# Patient Record
Sex: Female | Born: 1985 | Race: Black or African American | Hispanic: No | Marital: Married | Smoking: Never smoker
Health system: Southern US, Community
[De-identification: ages and names within clinical notes are randomized; demographics above are authoritative.]

## PROBLEM LIST (undated history)

## (undated) DIAGNOSIS — Z789 Other specified health status: Secondary | ICD-10-CM

## (undated) HISTORY — PX: NO PAST SURGERIES: SHX2092

---

## 2016-12-15 ENCOUNTER — Other Ambulatory Visit (HOSPITAL_COMMUNITY): Payer: Self-pay | Admitting: Obstetrics and Gynecology

## 2016-12-15 DIAGNOSIS — Z3689 Encounter for other specified antenatal screening: Secondary | ICD-10-CM

## 2016-12-15 DIAGNOSIS — Z3A18 18 weeks gestation of pregnancy: Secondary | ICD-10-CM

## 2016-12-15 DIAGNOSIS — O280 Abnormal hematological finding on antenatal screening of mother: Secondary | ICD-10-CM

## 2016-12-16 ENCOUNTER — Encounter (HOSPITAL_COMMUNITY): Payer: Self-pay | Admitting: *Deleted

## 2016-12-17 ENCOUNTER — Ambulatory Visit (HOSPITAL_COMMUNITY)
Admission: RE | Admit: 2016-12-17 | Discharge: 2016-12-17 | Disposition: A | Discharge status: Home / Self Care | Payer: Medicaid Other | Source: Ambulatory Visit | Attending: Obstetrics and Gynecology | Admitting: Obstetrics and Gynecology

## 2016-12-17 ENCOUNTER — Other Ambulatory Visit (HOSPITAL_COMMUNITY): Payer: Self-pay | Admitting: *Deleted

## 2016-12-17 ENCOUNTER — Encounter (HOSPITAL_COMMUNITY): Payer: Self-pay

## 2016-12-17 DIAGNOSIS — Z315 Encounter for genetic counseling: Secondary | ICD-10-CM | POA: Insufficient documentation

## 2016-12-17 DIAGNOSIS — Z3689 Encounter for other specified antenatal screening: Secondary | ICD-10-CM | POA: Insufficient documentation

## 2016-12-17 DIAGNOSIS — O289 Unspecified abnormal findings on antenatal screening of mother: Secondary | ICD-10-CM | POA: Diagnosis present

## 2016-12-17 DIAGNOSIS — Z3A18 18 weeks gestation of pregnancy: Secondary | ICD-10-CM | POA: Diagnosis not present

## 2016-12-17 DIAGNOSIS — O280 Abnormal hematological finding on antenatal screening of mother: Secondary | ICD-10-CM

## 2016-12-17 HISTORY — DX: Other specified health status: Z78.9

## 2016-12-17 NOTE — Progress Notes (Signed)
Genetic Counseling  High-Risk Gestation Note  Appointment Date:  12/17/2016 Referred By: Caroll Rancher, MD Date of Birth:  Jun 08, 1986   Pregnancy History: [id number] Estimated Date of Delivery: 05/17/17 Estimated Gestational Age: 80w3dAttending: MGriffin Dakin MD  Ms. DDanae OrleansAbdelKarim was seen for genetic counseling because of an increased risk for fetal Down syndrome [facility] Ms. DYvette Lovelessdeclined the use of medical interpreter.   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  We reviewed chromosomes, nondisjunction, and the common features and variable prognosis of Down syndrome.  In addition, we reviewed the screen adjusted reduction in risks for trisomy 18 and open neural tube defects.  We also discussed other explanations for a screen positive result including: a gestational dating error, differences in maternal metabolism, and normal variation.  We reviewed other available screening options including noninvasive prenatal screening (NIPS)/cell free DNA (cfDNA) screening, and detailed ultrasound.  She was counseled that screening tests are used to modify a patient's a priori risk for aneuploidy, typically based on age. This estimate provides a pregnancy specific risk assessment. We reviewed the  benefits and limitations of each option. Specifically, we discussed the conditions for which each test screens, the detection rates, and false positive rates of each. She was also counseled regarding diagnostic testing via amniocentesis. We reviewed the approximate 1 in 3505-697risk for complications from amniocentesis, including spontaneous pregnancy loss. We discussed the possible results that the tests might provide including: positive, negative, unanticipated, and no result. Finally, they were counseled regarding the cost of each option and potential out of pocket expenses. After consideration of all the options, she elected to proceed with NIPS (Panorama).  Those results will be available in 8-10 days.  She declined amniocentesis.   A complete ultrasound was performed today. The ultrasound report will be sent under separate cover. There were no visualized fetal anomalies or markers suggestive of aneuploidy. Diagnostic testing was declined today.  She understands that screening tests cannot rule out all birth defects or genetic syndromes. The patient was advised of this limitation and states she still does not want additional testing at this time.   Both family histories were reviewed and found to be contributory for consanguinity; the patient and her partner are reportedly second cousins. She reported that her paternal grandmother and his maternal grandmother are sisters to each other.  We discussed that children born to a consanguineous couple are at increased risk for genetic health problems.  This increase in risk is related to the possibility of passing on recessive genes. We explained that every person carries approximately 7-10 non-working genes that when received in a double dose results in recessive genetic conditions.  In general, unrelated couples have a relatively low risk of having a child with a recessive condition because the likelihood of both parents carrying the same non-working recessive  gene is very low.  However, when a couple is related, they have  inherited some of their genetic information from the same family member, which leads to an increased chance that they may carry the same recessive gene and have a child with a recessive condition.  For second cousin unions, the risk to have a child with a birth defect, intellectual disability, or genetic condition is increased approximately 1% above the general population risk (3-5%).  We reviewed chromosomes, genes, and recessive inheritance in detail.  There are a myriad of genetic disorders that occur more frequently in specific ethnic groups, those which can be traced to particular geographic locations. We discussed that although these genetic disorders are much more prevalent in specific ethnic groups, as families are becoming increasingly multiracial and multicultural, these conditions can occur in anyone from any race or ethnicity. For this reason, we discussed the availability of ethnic specific genetic carrier screening, professional society (ACOG) recommended carrier testing, and pan-ethnic carrier screening.   We reviewed that ACOG currently recommends that all patients be offered carrier screening for cystic fibrosis, spinal muscular atrophy and hemoglobinopathies. In addition, they were counseled that there are a variety of genetic screening laboratories that have pan-ethnic, or expanded, carrier screening panels, which evaluate carrier status for a wide range of genetic conditions. Some of these conditions are severe and actionable, but also rare; others occur more commonly, but are less severe. We discussed that testing options range from screening for a single condition to panels of more than 200 autosomal or X-linked genetic conditions. We reviewed that the prevalence of each condition varies (and often varies with ethnicity). Thus the couples' background risk to be a carrier for each of these various conditions would range, and in  some cases be very low or unknown. Similarly, the detection rate varies with each condition and also varies in some cases with ethnicity, ranging from greater than 99% (in the case of hemoglobinopathies) to unknown. We reviewed that a negative carrier screen would thus reduce, but not eliminate the chance to be a carrier for these conditions. For some conditions included on specific pan-ethnic carrier screening panels, the pre-test carrier frequency and/or the detection rate is unknown. Thus, for some conditions, the exact reduction of risk with a negative carrier screening result may not be able to be quantified. We reviewed that in the event that one partner is found to be a carrier for one or more conditions, carrier screening would be available to the partner for those conditions. After thoughtful consideration of their options, Ms. Jersi Mori elected to have the universal carrier screening panel through [facility]. This panel screens for carrier status of 175 hereditary disorders. We discussed that results will be available in approximately 2 weeks. Without further information regarding the provided family history, an accurate genetic risk cannot be calculated. Further genetic counseling is warranted if more information is obtained.  Ms. Laurabeth Tregoning denied exposure to environmental toxins or chemical agents. She denied the use of alcohol, tobacco or street drugs. She denied significant viral illnesses during the course of her pregnancy. Her medical and surgical histories were noncontributory.   I counseled Ms. Sacred Chestang for approximately 45 minutes regarding the above risks and available options.    , MS,  Certified Genetic Counselor 12/17/2016  

## 2016-12-23 ENCOUNTER — Other Ambulatory Visit: Payer: Self-pay

## 2016-12-24 ENCOUNTER — Telehealth (HOSPITAL_COMMUNITY): Payer: Self-pay | Admitting: MS"

## 2016-12-24 ENCOUNTER — Encounter (HOSPITAL_COMMUNITY): Payer: Self-pay

## 2016-12-24 ENCOUNTER — Other Ambulatory Visit: Payer: Self-pay

## 2016-12-24 NOTE — Telephone Encounter (Signed)
Called Patsy Kenton to discuss her prenatal cell free DNA test results.  Ms. Elenore PaddyDarsalam Guier had Panorama testing through [facility].  Testing was offered because of abnormal maternal serum screen.   The patient was identified by name and DOB.  We reviewed that these are within normal limits, showing a less than 1 in 10,000 risk for trisomies 21, 18 and 13, and monosomy X (Turner syndrome).  In addition, the risk for triploidy and sex chromosome trisomies (47,XXX and 47,XXY) was also low risk.  We reviewed that this testing identifies > 99% of pregnancies with trisomy 4421, trisomy 5413, sex chromosome trisomies (47,XXX and 47,XXY), and triploidy. The detection rate for trisomy 18 is 96%.  The detection rate for monosomy X is ~92%.  The false positive rate is <0.1% for all conditions. Testing was also consistent with female fetal sex.  The patient did wish to know fetal sex.  She understands that this testing does not identify all genetic conditions.  All questions were answered to her satisfaction, she was encouraged to call with additional questions or concerns. [facility] carrier screening (universal panel) is currently pending.   Quinn PlowmanKaren Nikia Levels, MS Certified Genetic Counselor 12/24/2016 2:34 PM

## 2016-12-27 ENCOUNTER — Other Ambulatory Visit: Payer: Self-pay

## 2016-12-28 ENCOUNTER — Telehealth (HOSPITAL_COMMUNITY): Payer: Self-pay | Admitting: MS"

## 2016-12-28 NOTE — Telephone Encounter (Signed)
Called Wendy May to discuss her carrier screening results. Wendy May had carrier screening for 175 autosomal recessive and X-linked conditions, including the ACOG recommended conditions (SMA, CF, and hemoglobinopathies) through [facility] given reported consanguinity for herself and her partner. The patient was identified by name and DOB. We reviewed that the results are negative, indicating that she does not have a detectable gene alteration in any of the genes for which analysis was performed. We reviewed that carrier screening does not detect all carriers of these conditions, but a normal result significantly decreases the likelihood of being a carrier, and therefore, the overall reproductive risk. We reviewed that [facility] sequences most of the genes, which is associated with a high detection rate for carriers, thus a negative screen is very reassuring. All questions were answered to her satisfaction, she was encouraged to call with additional questions or concerns. ? Quinn PlowmanKaren Nylan Nakatani, MS Patent attorneyCertified Genetic Counselor

## 2017-01-14 ENCOUNTER — Ambulatory Visit (HOSPITAL_COMMUNITY)
Admission: RE | Admit: 2017-01-14 | Discharge: 2017-01-14 | Disposition: A | Discharge status: Home / Self Care | Payer: Medicaid Other | Source: Ambulatory Visit | Attending: Obstetrics and Gynecology | Admitting: Obstetrics and Gynecology

## 2017-01-14 ENCOUNTER — Encounter (HOSPITAL_COMMUNITY): Payer: Self-pay

## 2017-01-14 DIAGNOSIS — Z3A22 22 weeks gestation of pregnancy: Secondary | ICD-10-CM | POA: Insufficient documentation

## 2017-01-14 DIAGNOSIS — O280 Abnormal hematological finding on antenatal screening of mother: Secondary | ICD-10-CM | POA: Insufficient documentation

## 2017-01-14 DIAGNOSIS — Z362 Encounter for other antenatal screening follow-up: Secondary | ICD-10-CM | POA: Insufficient documentation

## 2017-08-09 ENCOUNTER — Encounter (HOSPITAL_COMMUNITY): Payer: Self-pay

## 2018-05-15 IMAGING — US US MFM OB FOLLOW-UP
1 series · 14 of 28 positions shown · non-contrast
Comparison: none

[Series 1: us mfm ob follow-up · 52 acquisitions, 14 frames shown]
[im 2/52]
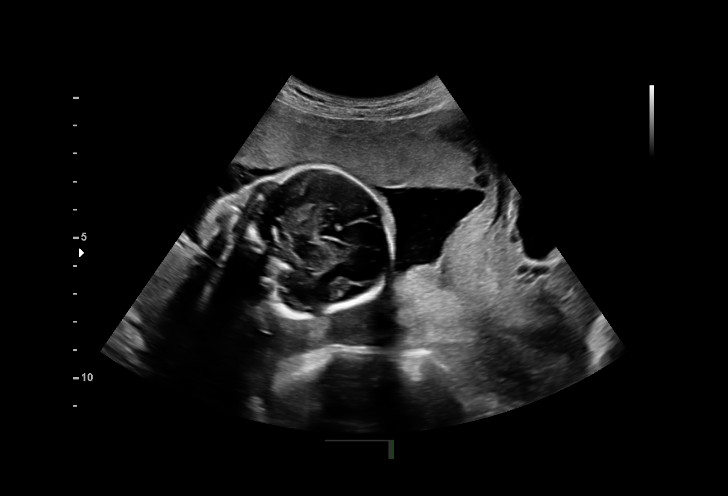
[im 6/52]
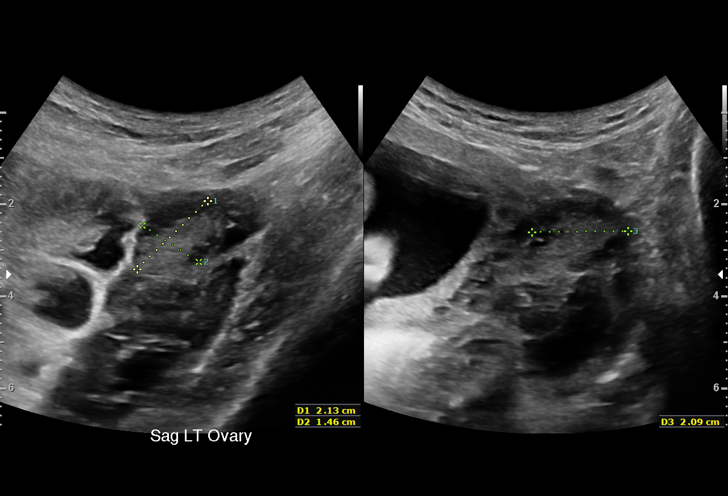
[im 10/52]
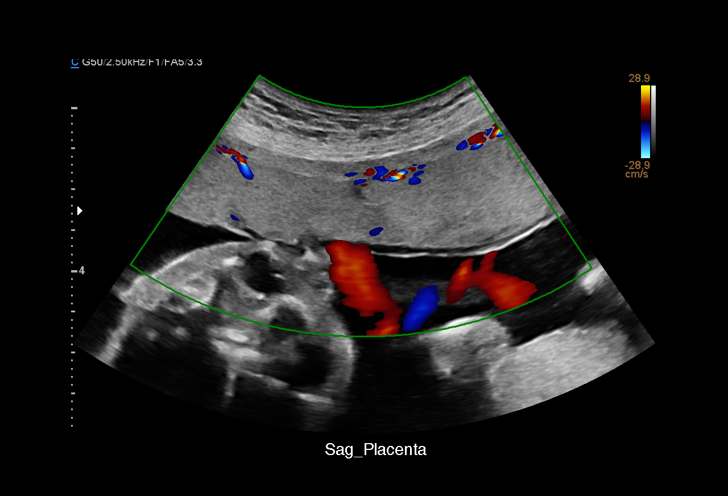
[im 14/52]
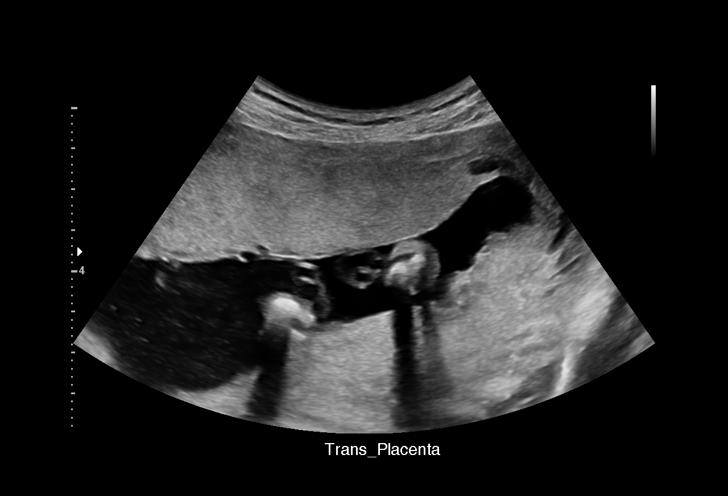
[im 18/52]
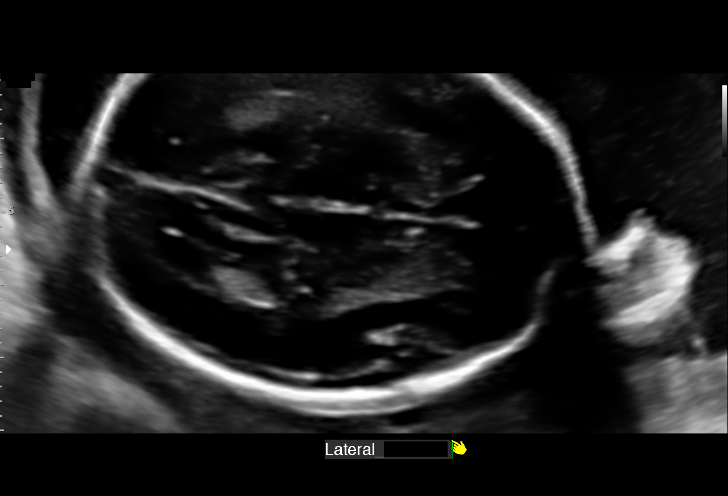
[im 21/52]
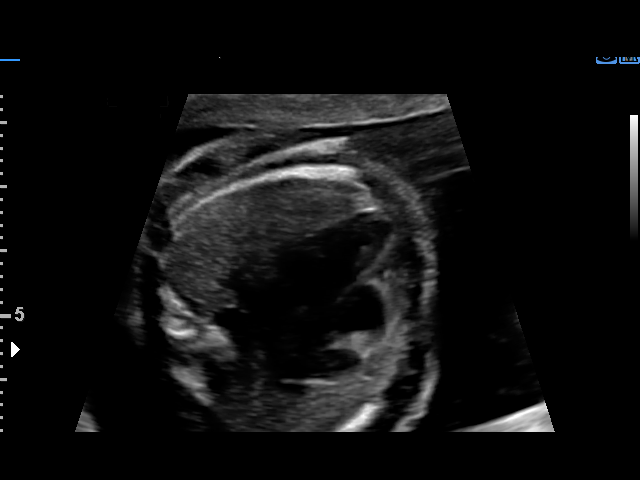
[im 25/52]
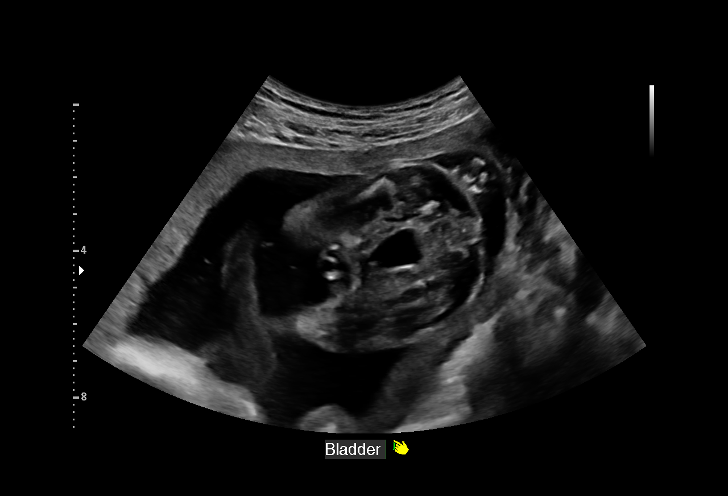
[im 29/52]
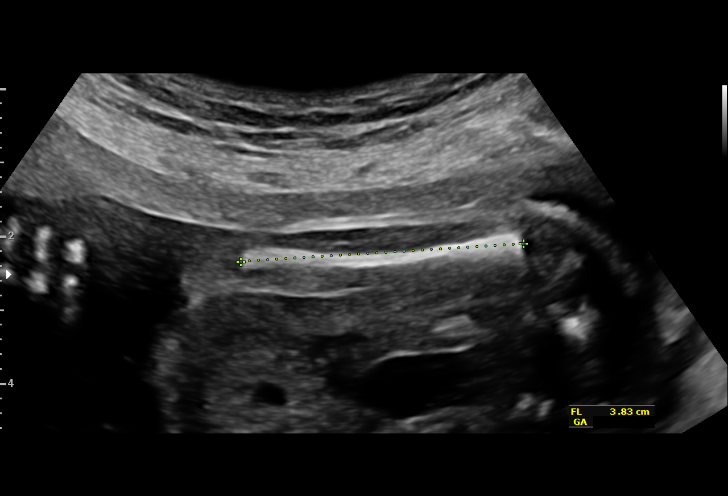
[im 33/52]
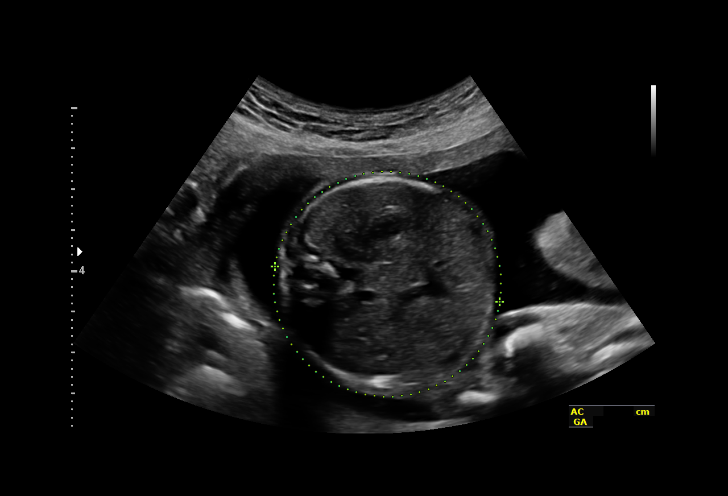
[im 36/52]
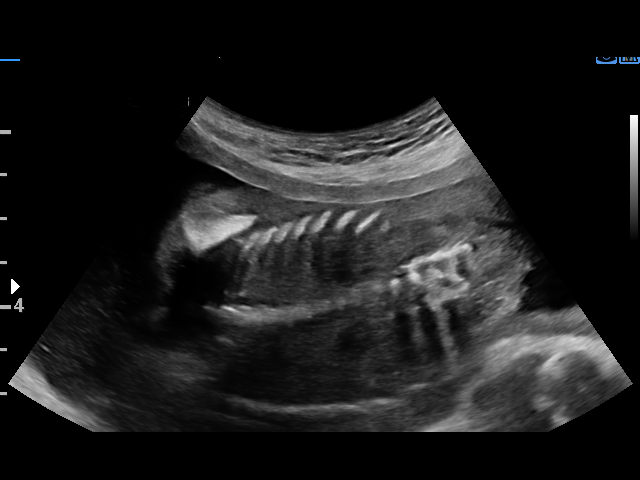
[im 40/52]
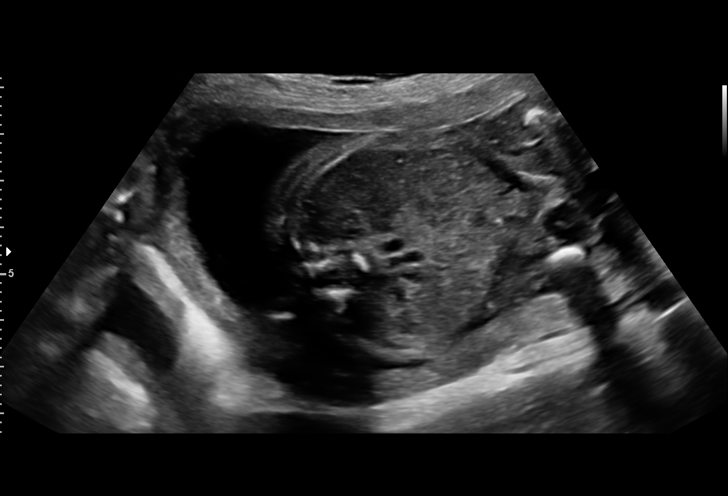
[im 44/52]
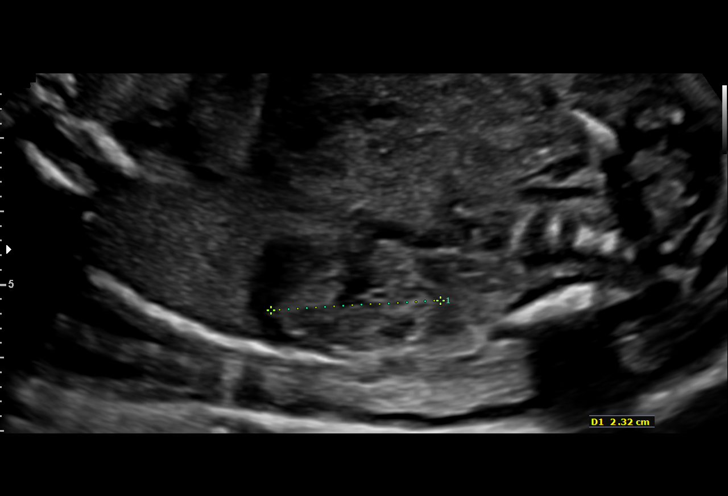
[im 48/52]
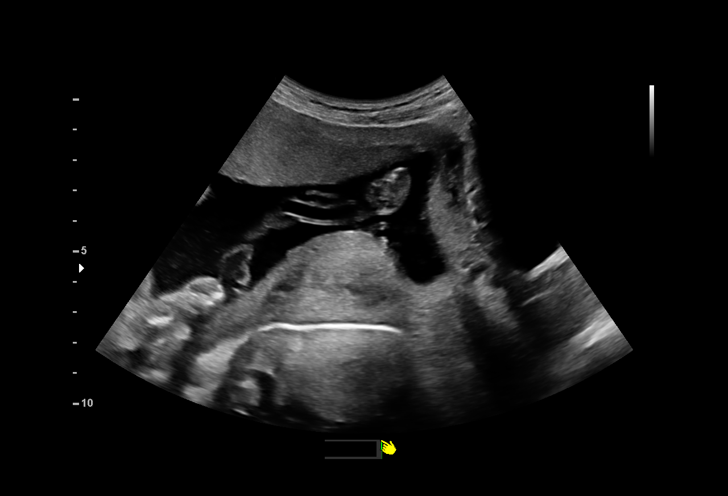
[im 52/52]
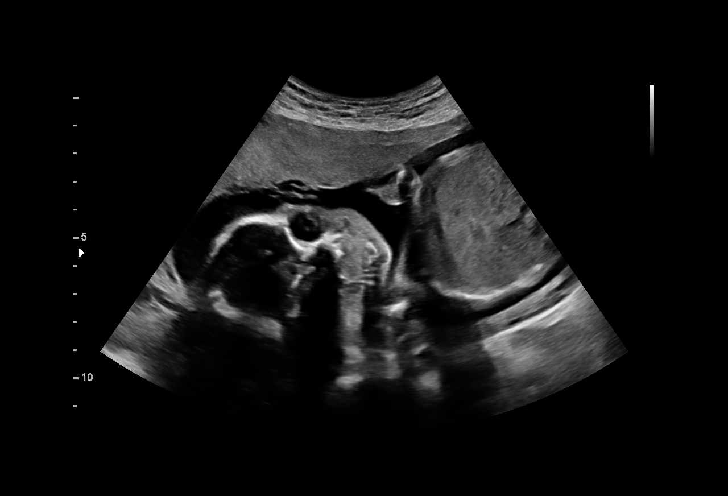

[14 of 28 positions shown; findings below may reference images not displayed]

[REDACTED]
[address]HOSPITAL][HOSPITAL]

1  LIJNIE ALKEMADE              [id number]      [id number]     [id number]
Indications

22 weeks gestation of pregnancy
Abnormal biochemical screen ([facility] [DATE])
(NIPS nml)
Encounter for other antenatal screening
follow-up
OB History

Blood Type:            Height:         Weight (lb):  124       BMI:
Gravidity:    3         Term:   2
Living:       2
Fetal Evaluation

Num Of Fetuses:     1
Fetal Heart         149
Rate(bpm):
Cardiac Activity:   Observed
Presentation:       Variable
Placenta:           Anterior, above cervical os
P. Cord Insertion:  Visualized, central

Amniotic Fluid
AFI FV:      Subjectively within normal limits

Largest Pocket(cm)
4.61
Biometry
BPD:      52.3  mm     G. Age:  21w 6d         26  %    CI:        72.48   %    70 - 86
FL/HC:      19.7   %    18.4 -
HC:      195.4  mm     G. Age:  21w 5d         15  %    HC/AC:      1.13        1.06 -
AC:      173.4  mm     G. Age:  22w 2d         38  %    FL/BPD:     73.4   %    71 - 87
FL:       38.4  mm     G. Age:  22w 2d         36  %    FL/AC:      22.1   %    20 - 24

Est. FW:     486  gm      1 lb 1 oz     44  %
Gestational Age

U/S Today:     22w 0d                                        EDD:   05/20/17
Best:          22w 3d     Det. By:  Previous Ultrasound      EDD:   05/17/17
(12/06/16)
Anatomy

Cranium:               Appears normal         Aortic Arch:            Previously seen
Cavum:                 Appears normal         Ductal Arch:            Previously seen
Ventricles:            Appears normal         Diaphragm:              Appears normal
Choroid Plexus:        Previously seen        Stomach:                Appears normal, left
sided
Cerebellum:            Previously seen        Abdomen:                Appears normal
Posterior Fossa:       Previously seen        Abdominal Wall:         Previously seen
Nuchal Fold:           Previously seen        Cord Vessels:           Previously seen
Face:                  Orbits and profile     Kidneys:                Appear normal
previously seen
Lips:                  Previously seen        Bladder:                Appears normal
Thoracic:              Appears normal         Spine:                  Previously seen
Heart:                 Appears normal         Upper Extremities:      Previously seen
(4CH, axis, and situs
RVOT:                  Appears normal         Lower Extremities:      Previously seen
LVOT:                  Appears normal

Other:  Fetus appears to be a female.  Heels and 5th digit previously seen.
Cervix Uterus Adnexa

Cervix
Length:           4.15  cm.
Normal appearance by transabdominal scan.

Uterus
No abnormality visualized.

Left Ovary
Within normal limits.

Right Ovary
Within normal limits.

Adnexa:       No abnormality visualized.
Impression

Singleton intrauterine pregnancy at 22+3 weeks with
abnormal maternal serum marker screen
Interval review of the anatomy shows no sonographic
markers for aneuploidy or structural anomalies
Amniotic fluid volume is normal
Estimated fetal weight is 486g which is growth in the 44th
percentile
Recommendations

Normal growth and development
Follow-up ultrasounds as clinically indicated.
